# Patient Record
Sex: Male | Born: 1985 | Race: Black or African American | Hispanic: No | Marital: Married | Smoking: Current every day smoker
Health system: Southern US, Community
[De-identification: ages and names within clinical notes are randomized; demographics above are authoritative.]

---

## 2012-05-27 ENCOUNTER — Emergency Department (HOSPITAL_COMMUNITY)
Admission: EM | Admit: 2012-05-27 | Discharge: 2012-05-27 | Disposition: A | Discharge status: Home / Self Care | Payer: Self-pay | Attending: Emergency Medicine | Admitting: Emergency Medicine

## 2012-05-27 ENCOUNTER — Encounter (HOSPITAL_COMMUNITY): Payer: Self-pay

## 2012-05-27 ENCOUNTER — Emergency Department (HOSPITAL_COMMUNITY): Payer: Self-pay

## 2012-05-27 DIAGNOSIS — F17200 Nicotine dependence, unspecified, uncomplicated: Secondary | ICD-10-CM | POA: Insufficient documentation

## 2012-05-27 DIAGNOSIS — M545 Low back pain, unspecified: Secondary | ICD-10-CM | POA: Insufficient documentation

## 2012-05-27 DIAGNOSIS — M25551 Pain in right hip: Secondary | ICD-10-CM

## 2012-05-27 DIAGNOSIS — M25559 Pain in unspecified hip: Secondary | ICD-10-CM | POA: Insufficient documentation

## 2012-05-27 MED ORDER — CYCLOBENZAPRINE HCL 10 MG PO TABS: 10.0000 mg | ORAL_TABLET | Freq: Two times a day (BID) | ORAL | Status: DC | PRN

## 2012-05-27 MED ORDER — PREDNISONE 20 MG PO TABS: 20.0000 mg | ORAL_TABLET | Freq: Every day | ORAL | Status: DC

## 2012-05-27 MED ORDER — HYDROCODONE-ACETAMINOPHEN 5-500 MG PO TABS: 1.0000 | ORAL_TABLET | Freq: Four times a day (QID) | ORAL | Status: DC | PRN

## 2012-05-27 MED ORDER — OXYCODONE-ACETAMINOPHEN 5-325 MG PO TABS
2.0000 | ORAL_TABLET | Freq: Once | ORAL | Status: AC
  Administered 2012-05-27: 2 via ORAL
  Filled 2012-05-27: qty 2

## 2012-05-27 NOTE — ED Provider Notes (Signed)
History     CSN: [id number]  Arrival date & time 05/27/12  1620   First MD Initiated Contact with Patient 05/27/12 1633      Chief Complaint  Patient presents with  . Back Pain    (Consider location/radiation/quality/duration/timing/severity/associated sxs/prior treatment) HPI  Pt to the ER with complaints of right sided low back pain that has been progressively getting worse for 3 months. Today he says it has gotten even worse. He endorses pain starting in low back then going out towards his hip then down his thigh to his knee. He denies having a hx of back pain or back injury. He denies systemic symptoms of weight loss, chills, N/V/D/F, dysuria, abdominal pain, testicular pain. No IV drug use. He is able to walk but with pain. He does not work but endorses that we walks a lot. He is in nad but does appear to be uncomfortable.  History reviewed. No pertinent past medical history.  History reviewed. No pertinent past surgical history.  No family history on file.  History  Substance Use Topics  . Smoking status: Current Every Day Smoker  . Smokeless tobacco: Not on file  . Alcohol Use: Yes      Review of Systems   Review of Systems  Gen: no weight loss, fevers, chills, night sweats  Eyes: no discharge or drainage, no occular pain or visual changes  Nose: no epistaxis or rhinorrhea  Mouth: no dental pain, no sore throat  Neck: no neck pain  Lungs:No wheezing, coughing or hemoptysis CV: no chest pain, palpitations, dependent edema or orthopnea  Abd: no abdominal pain, nausea, vomiting  GU: no dysuria or gross hematuria  MSK:  Low back pain  Neuro: no headache, no focal neurologic deficits  Skin: no abnormalities Psyche: negative.    Allergies  Review of patient's allergies indicates no known allergies.  Home Medications   Current Outpatient Rx  Name Route Sig Dispense Refill  . CYCLOBENZAPRINE HCL 10 MG PO TABS Oral Take 1 tablet (10 mg total) by mouth 2  (two) times daily as needed for muscle spasms. 20 tablet 0  . HYDROCODONE-ACETAMINOPHEN 5-500 MG PO TABS Oral Take 1-2 tablets by mouth every 6 (six) hours as needed for pain. 15 tablet 0  . PREDNISONE 20 MG PO TABS Oral Take 1 tablet (20 mg total) by mouth daily. 18 tablet 0    BP 133/68  Pulse 94  Temp 98.4 F (36.9 C) (Oral)  Resp 18  Ht 6\' 2"  (1.88 m)  Wt 190 lb (86.183 kg)  BMI 24.39 kg/m2  SpO2 100%  Physical Exam  Nursing note and vitals reviewed. Constitutional: He appears well-developed and well-nourished. No distress.  HENT:  Head: Normocephalic and atraumatic.  Eyes: Pupils are equal, round, and reactive to light.  Neck: Normal range of motion. Neck supple.  Cardiovascular: Normal rate and regular rhythm.   Pulmonary/Chest: Effort normal.  Abdominal: Soft.  Musculoskeletal:       Right upper leg: He exhibits tenderness (to palpation of paraspinal muscles). He exhibits no bony tenderness, no swelling, no edema, no deformity and no laceration.       Legs:       Equal strength to bilateral lower extremities. Neurosensory  function adequate to both legs. Skin color is normal. Skin is warm and moist. I see no step off deformity, no bony tenderness. Pt is able to ambulate without limp. Pain is relieved when sitting in certain positions. ROM is decreased due to pain. No  crepitus, laceration, effusion, swelling.  Pulses are normal   Neurological: He is alert.  Skin: Skin is warm and dry.    ED Course  Procedures (including critical care time)  Labs Reviewed - No data to display Dg Pelvis 1-2 Views  05/27/2012  *RADIOLOGY REPORT*  Clinical Data: Back pain  PELVIS - 1-2 VIEW  Comparison: None.  Findings: Single frontal view the pelvis submitted.  No acute fracture or subluxation.  No radiopaque foreign body.  No periosteal reaction or bony erosion.  IMPRESSION: No acute fracture or subluxation.   Original Report Authenticated By: Natasha Mead, M.D.      1. Low back pain     2. Right hip pain       MDM  Hip xray to r/o gross lytic lesion of the hip due to chronic and worsening pain without a cause. xrays normal.  Patient with back pain. No neurological deficits. Patient is ambulatory. No warning symptoms of back pain including: loss of bowel or bladder control, night sweats, waking from sleep with back pain, unexplained fevers or weight loss, h/o cancer, IVDU, recent trauma. No concern for cauda equina, epidural abscess, or other serious cause of back pain. Conservative measures such as rest, ice/heat and pain medicine indicated with PCP follow-up if no improvement with conservative management.          Dorthula Matas, PA 05/27/12 1708

## 2012-05-27 NOTE — ED Notes (Signed)
Pt presents with NAD- lower back pain rt side x 3 months with increased pain today.  Denies injury- Good CMS

## 2012-05-28 NOTE — ED Provider Notes (Signed)
Medical screening examination/treatment/procedure(s) were performed by non-physician practitioner and as supervising physician I was immediately available for consultation/collaboration.  Jones Skene, M.D.     Jones Skene, MD 05/28/12 1524

## 2012-06-23 ENCOUNTER — Emergency Department (HOSPITAL_COMMUNITY): Payer: Self-pay

## 2012-06-23 ENCOUNTER — Emergency Department (HOSPITAL_COMMUNITY)
Admission: EM | Admit: 2012-06-23 | Discharge: 2012-06-23 | Disposition: A | Discharge status: Home / Self Care | Payer: Self-pay | Attending: Emergency Medicine | Admitting: Emergency Medicine

## 2012-06-23 ENCOUNTER — Encounter (HOSPITAL_COMMUNITY): Payer: Self-pay | Admitting: Emergency Medicine

## 2012-06-23 DIAGNOSIS — F17200 Nicotine dependence, unspecified, uncomplicated: Secondary | ICD-10-CM | POA: Insufficient documentation

## 2012-06-23 DIAGNOSIS — S93409A Sprain of unspecified ligament of unspecified ankle, initial encounter: Secondary | ICD-10-CM | POA: Insufficient documentation

## 2012-06-23 DIAGNOSIS — W19XXXA Unspecified fall, initial encounter: Secondary | ICD-10-CM

## 2012-06-23 DIAGNOSIS — M25559 Pain in unspecified hip: Secondary | ICD-10-CM | POA: Insufficient documentation

## 2012-06-23 DIAGNOSIS — Y9301 Activity, walking, marching and hiking: Secondary | ICD-10-CM | POA: Insufficient documentation

## 2012-06-23 DIAGNOSIS — W108XXA Fall (on) (from) other stairs and steps, initial encounter: Secondary | ICD-10-CM | POA: Insufficient documentation

## 2012-06-23 DIAGNOSIS — Y929 Unspecified place or not applicable: Secondary | ICD-10-CM | POA: Insufficient documentation

## 2012-06-23 DIAGNOSIS — S96919A Strain of unspecified muscle and tendon at ankle and foot level, unspecified foot, initial encounter: Secondary | ICD-10-CM

## 2012-06-23 DIAGNOSIS — M25579 Pain in unspecified ankle and joints of unspecified foot: Secondary | ICD-10-CM | POA: Insufficient documentation

## 2012-06-23 MED ORDER — IBUPROFEN 800 MG PO TABS
800.0000 mg | ORAL_TABLET | Freq: Once | ORAL | Status: AC
  Administered 2012-06-23: 800 mg via ORAL
  Filled 2012-06-23: qty 1

## 2012-06-23 MED ORDER — NAPROXEN 500 MG PO TABS: 500.0000 mg | ORAL_TABLET | Freq: Two times a day (BID) | ORAL | Status: AC

## 2012-06-23 NOTE — ED Notes (Signed)
Pt presenting to ed with c/o falling last night on wet stairs pt states he's having right leg pain

## 2012-06-23 NOTE — ED Provider Notes (Signed)
History     CSN: [id number]  Arrival date & time 06/23/12  4098   First MD Initiated Contact with Patient 06/23/12 1040      Chief Complaint  Patient presents with  . Fall  . Leg Pain    (Consider location/radiation/quality/duration/timing/severity/associated sxs/prior treatment) Patient is a 26 y.o. male presenting with fall. The history is provided by the patient.  Fall Incident onset: 11pm  The fall occurred while walking (down stairs). He fell from a height of 3 to 5 ft. He landed on a hard floor. There was no blood loss. The point of impact was the right hip. Pain location: right ankle & right hip. The pain is at a severity of 5/10. The pain is mild. He was ambulatory at the scene. There was no entrapment after the fall. There was no drug use involved in the accident. There was no alcohol use involved in the accident. Pertinent negatives include no visual change, no fever, no numbness, no abdominal pain, no bowel incontinence, no nausea, no vomiting, no hematuria, no headaches, no hearing loss and no loss of consciousness. The symptoms are aggravated by standing and ambulation. He has tried nothing for the symptoms.    History reviewed. No pertinent past medical history.  History reviewed. No pertinent past surgical history.  No family history on file.  History  Substance Use Topics  . Smoking status: Current Every Day Smoker -- 1.5 packs/day    Types: Cigarettes  . Smokeless tobacco: Not on file  . Alcohol Use: Yes     Comment: weekends      Review of Systems  Constitutional: Negative for fever, chills and appetite change.  HENT: Negative for congestion.   Eyes: Negative for visual disturbance.  Respiratory: Negative for shortness of breath.   Cardiovascular: Negative for chest pain and leg swelling.  Gastrointestinal: Negative for nausea, vomiting, abdominal pain and bowel incontinence.  Genitourinary: Negative for dysuria, urgency, frequency and hematuria.    Neurological: Negative for dizziness, loss of consciousness, syncope, weakness, light-headedness, numbness and headaches.  Psychiatric/Behavioral: Negative for confusion.  All other systems reviewed and are negative.    Allergies  Review of patient's allergies indicates no known allergies.  Home Medications  No current outpatient prescriptions on file.  BP 138/76  Pulse 60  Temp 98.5 F (36.9 C) (Oral)  Resp 16  SpO2 98%  Physical Exam  Nursing note and vitals reviewed. Constitutional: He appears well-developed and well-nourished. No distress.  HENT:  Head: Normocephalic and atraumatic.  Eyes: Conjunctivae normal and EOM are normal.  Neck: Normal range of motion. Neck supple.  Cardiovascular:       Intact distal pulses, capillary refill < 3 seconds  Musculoskeletal:       Right hip ttp, nl passive and active ROM. Right ankle with mild lateral malleolous swelling & tenderness. Pain w ROM All other extremities with normal ROM  Neurological:       No sensory deficit  Skin: He is not diaphoretic.       Skin intact, no tenting    ED Course  Procedures (including critical care time)  Labs Reviewed - No data to display Dg Hip Complete Right  06/23/2012  *RADIOLOGY REPORT*  Clinical Data: Fall, right hip pain  RIGHT HIP - COMPLETE 2+ VIEW  Comparison: None.  Findings: Normal alignment without displaced fracture.  Intact trabecular pattern.  No significant degenerative process.  Femoral head contours appear symmetric and normal.  Pelvis intact.  Normal SI joints and  no diastasis.  IMPRESSION: No acute osseous finding by plain radiography   Original Report Authenticated By: Judie Petit. Shick, M.D.    Dg Ankle Complete Right  06/23/2012  *RADIOLOGY REPORT*  Clinical Data: Fall, right ankle pain  RIGHT ANKLE - COMPLETE 3+ VIEW  Comparison: None.  Findings: Normal alignment without fracture or focal swelling. Intact malleoli, talus and calcaneus.  IMPRESSION: No acute finding   Original  Report Authenticated By: Judie Petit. Miles Costain, M.D.      No diagnosis found.  MDM  Fall, left hip pain & left ankle pain/strain  Patient X-Ray negative for obvious fracture or dislocation. Pain managed in ED. Pt advised to follow up with orthopedics if symptoms persist for possibility of missed fracture diagnosis. Patient given brace while in ED, conservative therapy recommended and discussed. Patient will be dc home & is agreeable with above plan.         Jaci Carrel, [place] 06/23/12 1217

## 2012-06-23 NOTE — ED Provider Notes (Signed)
History/physical exam/procedure(s) were performed by non-physician practitioner and as supervising physician I was immediately available for consultation/collaboration. I have reviewed all notes and am in agreement with care and plan.   Azilee Pirro S [place] Skalicky, MD 06/23/12 1605 

## 2012-06-23 NOTE — ED Notes (Signed)
Pt is unable to put pressure on right leg. Pt states he feel like if he stretches his leg out his hip pushes outward.

## 2012-06-23 NOTE — Care Management ED Note (Signed)
       CARE MANAGEMENT ED NOTE 06/23/2012  Patient:  Robert Farrell, Robert Farrell   Account Number:  [id number]  Date Initiated:  06/23/2012  Documentation initiated by:  Edd Arbour  Subjective/Objective Assessment:   26 year old male self pay no insurance coverage [place] resident     Subjective/Objective Assessment Detail:   fall last night right hip and ankel pain ED xray negative     Action/Plan:   CM and [facility] [employer] liaison spoke with her Pt offered [facility] [employer] [facility] services to assist w/finding a [place] self pay providers, health reform information   Action/Plan Detail:   Anticipated DC Date:  06/23/2012     Status Recommendation to Physician:   Result of Recommendation:    Other ED Services  Consult Working Plan    DC Planning Services  [facility]  [facility] (established/new)  Outpatient Services - Pt will follow up  Other    Choice offered to / List presented to:            Status of service:  Completed, signed off  ED Comments:   ED Comments Detail:

## 2012-06-23 NOTE — Progress Notes (Signed)
Subjective/Objective Assessment:   26 year old male self pay no insurance coverage [place] resident    Subjective/Objective Assessment Detail:   fall last night right hip and ankel pain ED xray negative    Action/Plan:   CM and [facility] [employer] liaison spoke with her Pt offered [facility] [employer] [facility] services to assist w/finding a [place] self pay providers, health reform information  Action/Plan Detail:   Anticipated DC Date:  06/23/2012     Status Recommendation to Physician:   Result of Recommendation:    Other ED Services Consult Working Plan   DC Planning Services [facility] / [facility] (established/new) Outpatient Services - Pt will follow up Other

## 2012-06-23 NOTE — ED Notes (Signed)
Pt fell down 1 fight of stairs last night and fell on right hip. Pt complain of right hip pain and ankle pain. Right ankle is swollen.

## 2014-02-13 IMAGING — CR DG PELVIS 1-2V
1 series · 1 of 1 positions shown · non-contrast
Comparison: None.

CLINICAL DATA: Back pain

PELVIS - 1-2 VIEW

[t pelvis ap]
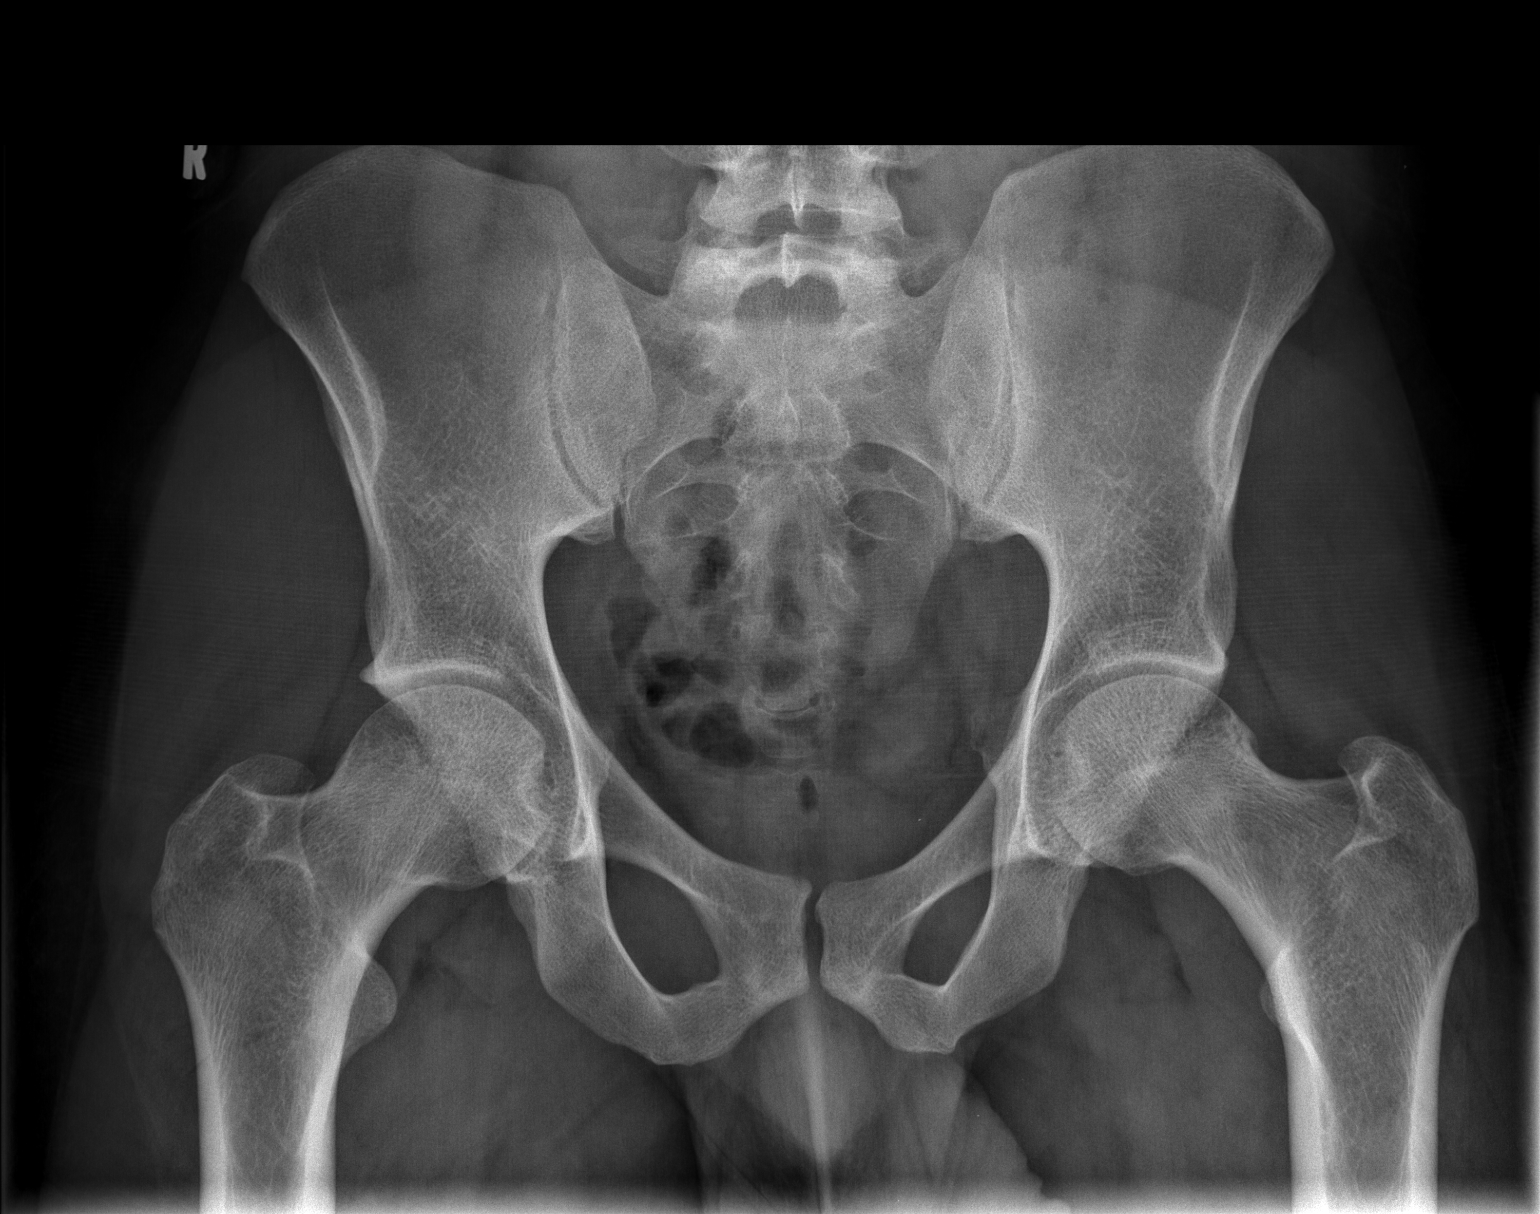

[1 of 1 positions shown; findings below may reference images not displayed]

FINDINGS: Single frontal view the pelvis submitted.  No acute
fracture or subluxation.  No radiopaque foreign body.  No
periosteal reaction or bony erosion.
IMPRESSION: No acute fracture or subluxation.

## 2014-03-12 IMAGING — CR DG ANKLE COMPLETE 3+V*R*
3 series · 3 of 3 positions shown · non-contrast
Comparison: None.

CLINICAL DATA: Fall, right ankle pain

RIGHT ANKLE - COMPLETE 3+ VIEW

[x ankle ap right]
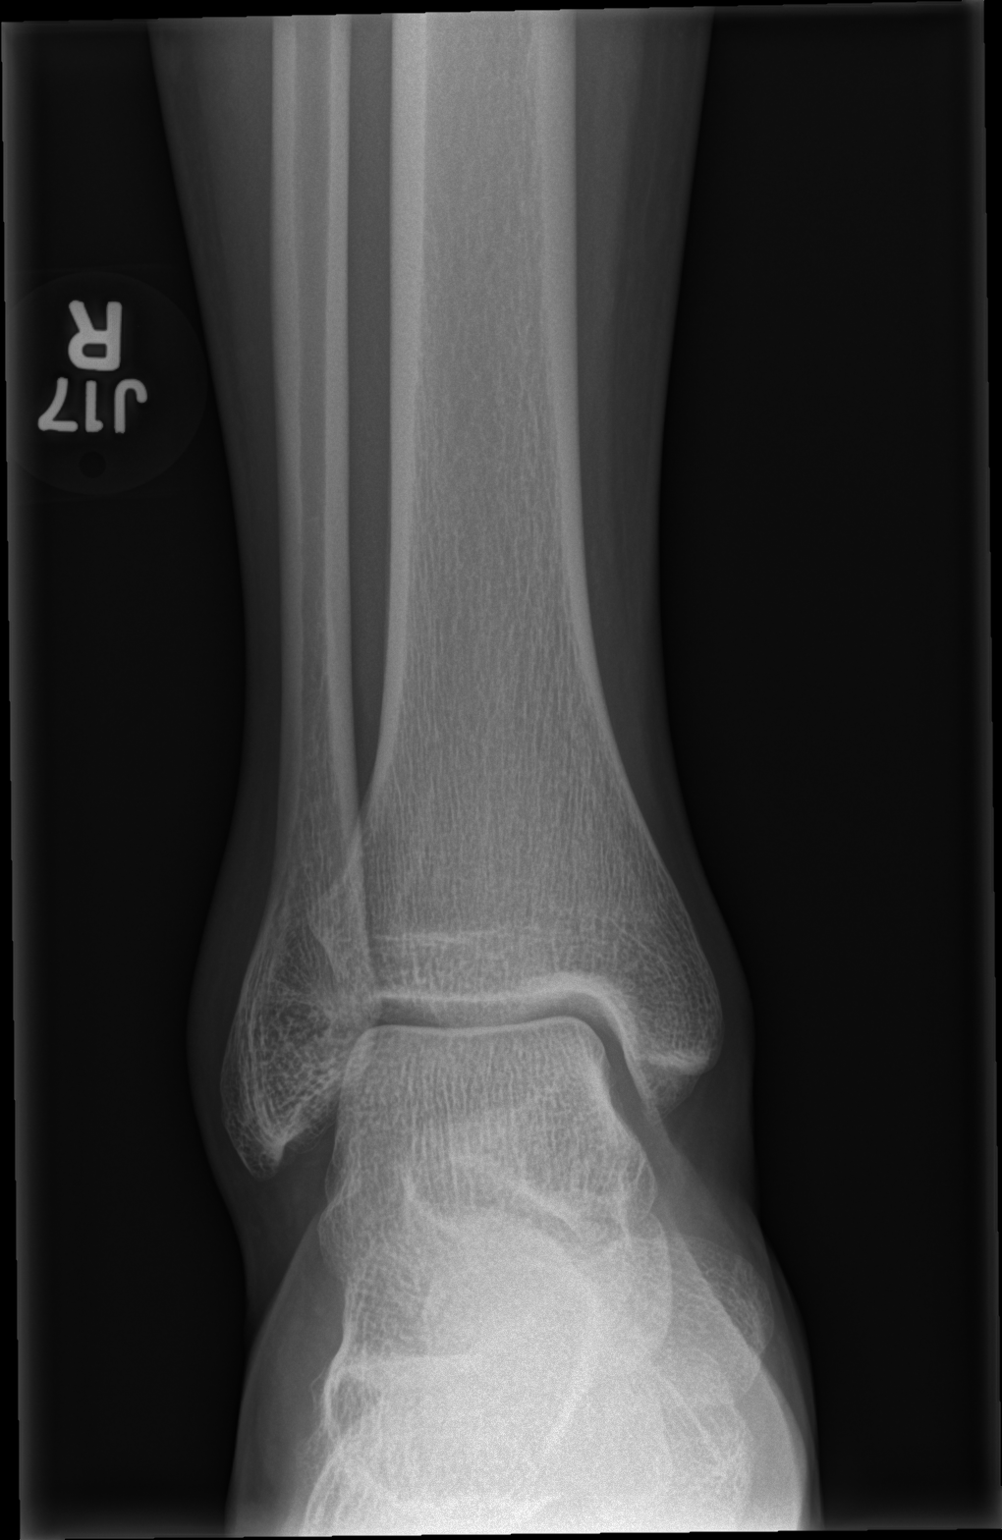

[x ankle obl right]
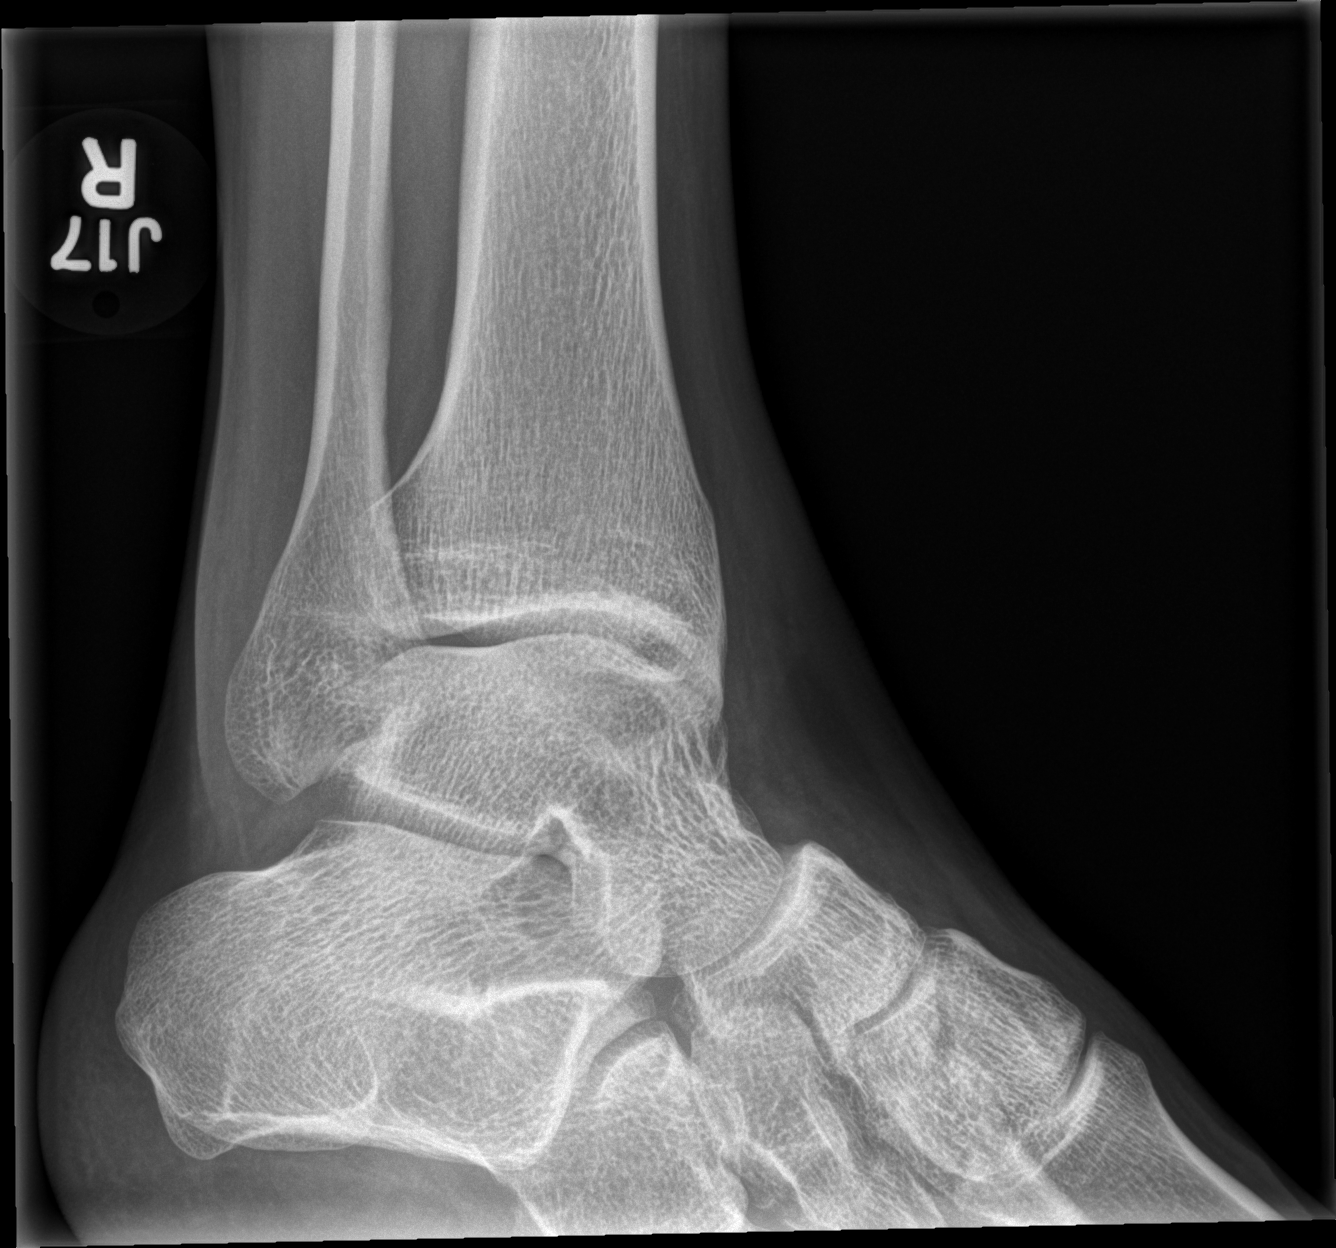

[x ankle lat right]
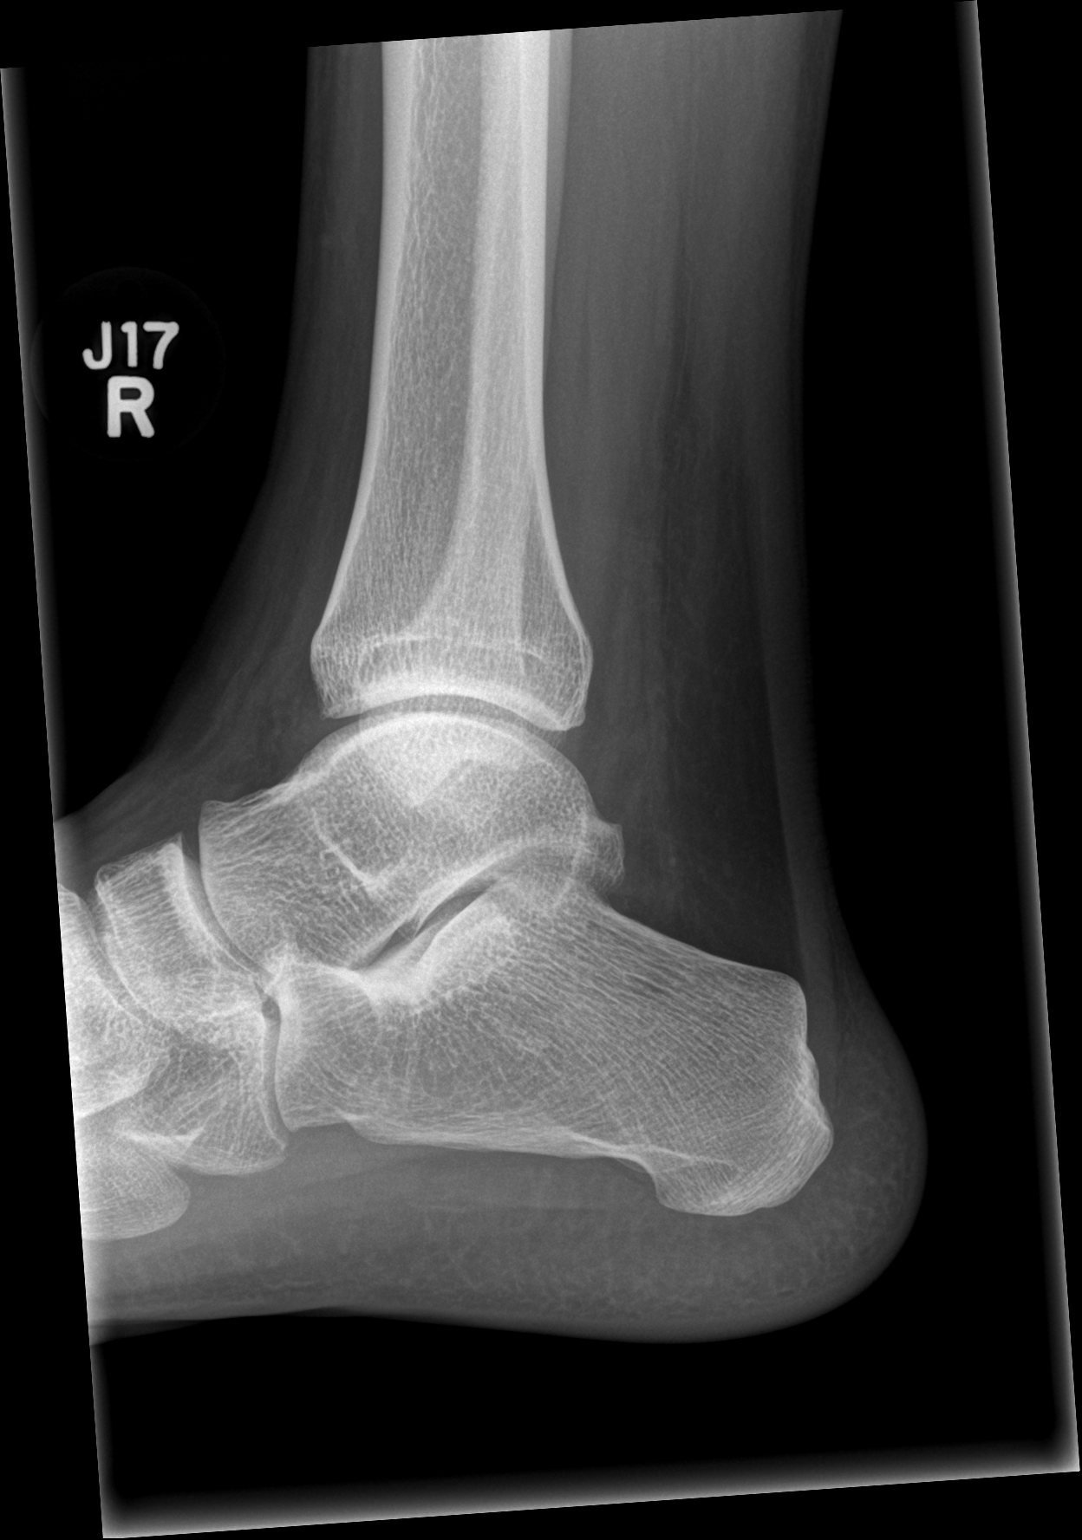

[3 of 3 positions shown; findings below may reference images not displayed]

FINDINGS: Normal alignment without fracture or focal swelling.
Intact malleoli, talus and calcaneus.
IMPRESSION: No acute finding

## 2017-05-27 ENCOUNTER — Telehealth: Payer: Self-pay | Admitting: Pediatrics

## 2017-05-27 NOTE — Telephone Encounter (Signed)
Opened in error
